# Patient Record
Sex: Female | Born: 1995 | Race: White | Hispanic: No | Marital: Single | Smoking: Current every day smoker
Health system: Southern US, Community
[De-identification: ages and names within clinical notes are randomized; demographics above are authoritative.]

## PROBLEM LIST (undated history)

## (undated) DIAGNOSIS — J45909 Unspecified asthma, uncomplicated: Secondary | ICD-10-CM

## (undated) HISTORY — DX: Unspecified asthma, uncomplicated: J45.909

---
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
## 2018-04-14 LAB — HM HIV SCREENING LAB: HM HIV Screening: NEGATIVE

## 2019-01-07 DIAGNOSIS — E669 Obesity, unspecified: Secondary | ICD-10-CM | POA: Insufficient documentation

## 2019-06-21 ENCOUNTER — Other Ambulatory Visit: Payer: Self-pay

## 2019-06-21 DIAGNOSIS — Z20822 Contact with and (suspected) exposure to covid-19: Secondary | ICD-10-CM

## 2019-06-26 LAB — NOVEL CORONAVIRUS, NAA: SARS-CoV-2, NAA: NOT DETECTED

## 2019-08-10 DIAGNOSIS — Z6839 Body mass index (BMI) 39.0-39.9, adult: Secondary | ICD-10-CM

## 2019-08-10 DIAGNOSIS — E669 Obesity, unspecified: Secondary | ICD-10-CM

## 2019-08-11 ENCOUNTER — Encounter: Payer: Self-pay | Admitting: Nurse Practitioner

## 2019-08-11 ENCOUNTER — Ambulatory Visit (LOCAL_COMMUNITY_HEALTH_CENTER): Payer: Medicaid Other | Admitting: Nurse Practitioner

## 2019-08-11 ENCOUNTER — Other Ambulatory Visit: Payer: Self-pay

## 2019-08-11 VITALS — BP 113/68 | Ht 67.0 in | Wt 261.0 lb

## 2019-08-11 DIAGNOSIS — Z3009 Encounter for other general counseling and advice on contraception: Secondary | ICD-10-CM | POA: Diagnosis not present

## 2019-08-11 DIAGNOSIS — Z3042 Encounter for surveillance of injectable contraceptive: Secondary | ICD-10-CM

## 2019-08-11 DIAGNOSIS — Z30013 Encounter for initial prescription of injectable contraceptive: Secondary | ICD-10-CM | POA: Diagnosis not present

## 2019-08-11 MED ORDER — MEDROXYPROGESTERONE ACETATE 150 MG/ML IM SUSP
150.0000 mg | Freq: Once | INTRAMUSCULAR | Status: AC
  Administered 2019-08-11: 17:00:00 150 mg via INTRAMUSCULAR

## 2019-08-11 NOTE — Progress Notes (Signed)
Here today for Depo and STD testing. 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 Hal Morales, RN  Wet Prep results reviewed. No treatment indicated per standing orders. Depo given per verbal provider order. Hal Morales, RN

## 2019-08-11 NOTE — Progress Notes (Signed)
[facility]  Subjective:  Sarah RedderSavannah L Mcintyre is a 23 y.o. being seen today for   Chief Complaint  Patient presents with  . Contraception    Depo  . Vaginal Discharge    DMPA injection and wet mount  Admits to c/o - Foul odor x 1 weeks LMP - Monday (08/08/2019) Last sex - 08/07/2019  Admits to - Sometimes use of condoms 1 partner in last 2 months Both - Oral and vaginal sex  Denies any additional concerns or complaints at this time     Does the patient have a current or past history of drug use? No   No components found for: HCV]   Health Maintenance Due  Topic Date Due  . CHLAMYDIA SCREENING  09/25/2013  . TETANUS/TDAP  09/25/2017  . INFLUENZA VACCINE  07/23/2019    Review of Systems  All other systems reviewed and are negative.   The following portions of the patient's history were reviewed and updated as appropriate: allergies, current medications, past family history, past medical history, past social history, past surgical history and problem list. Problem list updated.   See flowsheet for other program required questions.  Objective:   Vitals:   08/11/19 1525  BP: 113/68  Weight: 261 lb (118.4 kg)  Height: 5\' 7"  (1.702 m)    Physical Exam Vitals signs and nursing note reviewed.  Constitutional:      Appearance: Normal appearance.  HENT:     Head: Normocephalic and atraumatic.     Mouth/Throat:     Mouth: Mucous membranes are moist.     Pharynx: Oropharynx is clear. No oropharyngeal exudate or posterior oropharyngeal erythema.  Pulmonary:     Effort: Pulmonary effort is normal.  Abdominal:     General: Abdomen is flat.     Palpations: There is no mass.     Tenderness: There is no abdominal tenderness. There is no rebound.  Genitourinary:    General: Normal vulva.     Exam position: Lithotomy position.     Pubic Area: No rash or pubic lice.      Labia:        Right: No rash or  lesion.        Left: No rash or lesion.      Vagina: Normal. No vaginal discharge, erythema, bleeding or lesions.     Cervix: No cervical motion tenderness, discharge, friability, lesion or erythema.     Uterus: Normal.      Adnexa: Right adnexa normal and left adnexa normal.     Rectum: Normal.  Lymphadenopathy:     Head:     Right side of head: No preauricular or posterior auricular adenopathy.     Left side of head: No preauricular or posterior auricular adenopathy.     Cervical: No cervical adenopathy.     Upper Body:     Right upper body: No supraclavicular or axillary adenopathy.     Left upper body: No supraclavicular or axillary adenopathy.     Lower Body: No right inguinal adenopathy. No left inguinal adenopathy.  Skin:    General: Skin is warm and dry.     Findings: No rash.  Neurological:     Mental Status: She is alert and oriented to person, place, and time.       Assessment and Plan:  Sarah RedderSavannah L Mcintyre is a 23 y.o. female [facility]   1. Family planning services Await results - WET PREP FOR [place], [place] - please treat wet mount per standing order - Gonococcus culture - Chlamydia/Gonorrhea [facility]   2. Encounter for surveillance of injectable contraceptive Please give  - medroxyPROGESTERone (DEPO-PROVERA) injection 150 mg Today - advised client to RTC in 11-13 wks for DMPA and yearly PE.  - last DMPA on 04/18/2019 - called in by Antoine Primas, [place]   Return for 11-13 weeks for DMPA, Yearly physical exam.  No future appointments.  Berniece Andreas, NP

## 2019-08-12 ENCOUNTER — Encounter: Payer: Self-pay | Admitting: Nurse Practitioner

## 2019-08-12 DIAGNOSIS — J4520 Mild intermittent asthma, uncomplicated: Secondary | ICD-10-CM | POA: Insufficient documentation

## 2019-08-12 LAB — WET PREP FOR TRICH, YEAST, CLUE
Trichomonas Exam: NEGATIVE
Yeast Exam: NEGATIVE

## 2019-08-15 LAB — GONOCOCCUS CULTURE

## 2019-11-16 ENCOUNTER — Other Ambulatory Visit: Payer: Self-pay

## 2019-11-16 ENCOUNTER — Ambulatory Visit (LOCAL_COMMUNITY_HEALTH_CENTER): Payer: Medicaid Other

## 2019-11-16 VITALS — BP 122/78 | Ht 67.0 in | Wt 254.0 lb

## 2019-11-16 DIAGNOSIS — Z3042 Encounter for surveillance of injectable contraceptive: Secondary | ICD-10-CM

## 2019-11-16 DIAGNOSIS — Z30013 Encounter for initial prescription of injectable contraceptive: Secondary | ICD-10-CM | POA: Diagnosis not present

## 2019-11-16 DIAGNOSIS — Z3009 Encounter for other general counseling and advice on contraception: Secondary | ICD-10-CM | POA: Diagnosis not present

## 2019-11-16 MED ORDER — MEDROXYPROGESTERONE ACETATE 150 MG/ML IM SUSP
150.0000 mg | Freq: Once | INTRAMUSCULAR | Status: AC
  Administered 2019-11-16: 150 mg via INTRAMUSCULAR

## 2019-11-16 NOTE — Progress Notes (Signed)
Last physical at [facility] was 04/14/2018. Last depo at [facility] was 08/11/2019; 13.6 weeks post depo today. Pt turned 23 yrs old last month. Consulted with provider regarding pt request to continue with depo today. Per verbal order by Antoine Primas, PA, administered DMPA 150 mg IM today, and pt to schedule physical (with PAP) when next depo is due.

## 2019-12-05 ENCOUNTER — Other Ambulatory Visit: Payer: Self-pay

## 2019-12-05 ENCOUNTER — Ambulatory Visit
Admission: EM | Admit: 2019-12-05 | Discharge: 2019-12-05 | Disposition: A | Discharge status: Home / Self Care | Payer: Self-pay | Attending: Emergency Medicine | Admitting: Emergency Medicine

## 2019-12-05 DIAGNOSIS — M26621 Arthralgia of right temporomandibular joint: Secondary | ICD-10-CM | POA: Insufficient documentation

## 2019-12-05 DIAGNOSIS — Z0189 Encounter for other specified special examinations: Secondary | ICD-10-CM | POA: Insufficient documentation

## 2019-12-05 DIAGNOSIS — J029 Acute pharyngitis, unspecified: Secondary | ICD-10-CM

## 2019-12-05 DIAGNOSIS — F17210 Nicotine dependence, cigarettes, uncomplicated: Secondary | ICD-10-CM

## 2019-12-05 LAB — POCT RAPID STREP A (OFFICE): Rapid Strep A Screen: NEGATIVE

## 2019-12-05 NOTE — ED Triage Notes (Signed)
Pt presents to the UC with right sided jaw pain and right era pain x 3 days. Pt reports her job required a COVID test.

## 2019-12-05 NOTE — ED Provider Notes (Signed)
Renaldo Fiddler    CSN: [id number] Arrival date & time: 12/05/19  1450      History   Chief Complaint Chief Complaint  Patient presents with  . Otalgia  . covid test    HPI CHEETARA HOGE is a 23 y.o. female.   Patient presents with right side jaw pain and "popping" of her right TMJ x 3 days.  She also reports mild sore throat and right ear pain.  Patient states her work is requiring a COVID test.  She denies fever, chills, toothache, difficulty swallowing, congestion, cough, shortness of breath, vomiting, diarrhea, or other symptoms.  No treatments attempted at home.  The history is provided by the patient.    Past Medical History:  Diagnosis Date  . Asthma     Patient Active Problem List   Diagnosis Date Noted  . Intermittent asthma 08/12/2019  . Obesity, unspecified 01/07/2019    History reviewed. No pertinent surgical history.  OB History   No obstetric history on file.      Home Medications    Prior to Admission medications   Medication Sig Start Date End Date Taking? Authorizing Provider  albuterol (PROVENTIL) (2.5 MG/3ML) 0.083% nebulizer solution Inhale into the lungs. 04/19/19 04/18/20  [provider]  beclomethasone (QVAR) 40 MCG/ACT inhaler Inhale into the lungs. 04/19/19   [provider]    Family History Family History  Problem Relation Age of Onset  . Migraines Mother   . Melanoma Maternal Grandmother   . Clotting disorder Maternal Grandfather        blood clots in legs  . Hypertension Maternal Grandfather   . Seizures Maternal Grandfather   . Diabetes Paternal Grandmother     Social History Social History   Tobacco Use  . Smoking status: Current Every Day Smoker    Packs/day: 0.50    Types: Cigarettes  . Smokeless tobacco: Never Used  Substance Use Topics  . Alcohol use: Not Currently  . Drug use: Never     Allergies   Patient has no known allergies.   Review of Systems Review of Systems    Constitutional: Negative for chills and fever.  HENT: Positive for ear pain and sore throat. Negative for congestion, dental problem, facial swelling, rhinorrhea and trouble swallowing.   Eyes: Negative for pain and visual disturbance.  Respiratory: Negative for cough and shortness of breath.   Cardiovascular: Negative for chest pain and palpitations.  Gastrointestinal: Negative for abdominal pain, diarrhea and vomiting.  Genitourinary: Negative for dysuria and hematuria.  Musculoskeletal: Positive for arthralgias. Negative for back pain.  Skin: Negative for color change and rash.  Neurological: Negative for seizures and syncope.  All other systems reviewed and are negative.    Physical Exam Triage Vital Signs ED Triage Vitals  Enc Vitals Group     BP 12/05/19 1457 120/80     Pulse Rate 12/05/19 1457 69     Resp 12/05/19 1457 16     Temp 12/05/19 1457 98.9 F (37.2 C)     Temp Source 12/05/19 1457 Temporal     SpO2 12/05/19 1457 98 %     Weight --      Height --      Head Circumference --      Peak Flow --      Pain Score 12/05/19 1456 6     Pain Loc --      Pain Edu? --      Excl. in GC? --  No data found.  Updated Vital Signs BP 120/80 (BP Location: Right Arm)   Pulse 69   Temp 98.9 F (37.2 C) (Temporal)   Resp 16   SpO2 98%   Visual Acuity Right Eye Distance:   Left Eye Distance:   Bilateral Distance:    Right Eye Near:   Left Eye Near:    Bilateral Near:     Physical Exam Vitals and nursing note reviewed.  Constitutional:      General: She is not in acute distress.    Appearance: She is well-developed.  HENT:     Head: Normocephalic and atraumatic.     Right Ear: Tympanic membrane and ear canal normal.     Left Ear: Tympanic membrane and ear canal normal.     Nose: Nose normal.     Mouth/Throat:     Mouth: Mucous membranes are moist.     Pharynx: Posterior oropharyngeal erythema present.  Eyes:     Conjunctiva/sclera: Conjunctivae normal.   Cardiovascular:     Rate and Rhythm: Normal rate and regular rhythm.     Heart sounds: No murmur.  Pulmonary:     Effort: Pulmonary effort is normal. No respiratory distress.     Breath sounds: Normal breath sounds.  Abdominal:     General: Bowel sounds are normal.     Palpations: Abdomen is soft.     Tenderness: There is no abdominal tenderness. There is no guarding or rebound.  Musculoskeletal:     Cervical back: Neck supple.  Skin:    General: Skin is warm and dry.     Findings: No rash.  Neurological:     General: No focal deficit present.     Mental Status: She is alert and oriented to person, place, and time.  Psychiatric:        Mood and Affect: Mood normal.        Behavior: Behavior normal.      UC Treatments / Results  Labs (all labs ordered are listed, but only abnormal results are displayed) Labs Reviewed  NOVEL CORONAVIRUS, NAA  CULTURE, GROUP A STREP [facility]  POCT RAPID STREP A (OFFICE)    EKG   Radiology No results found.  Procedures Procedures (including critical care time)  Medications Ordered in UC Medications - No data to display  Initial Impression / Assessment and Plan / UC Course  I have reviewed the triage vital signs and the nursing notes.  Pertinent labs & imaging results that were available during my care of the patient were reviewed by me and considered in my medical decision making (see chart for details).    Right TMJ pain.  Patient request for COVID test.  Rapid strep negative; throat culture pending.  Instructed patient to take Tylenol as needed for discomfort.  Instructed her to rest her TMJ and try not to open her jaw wide enough for it to "pop".  Instructed patient to follow-up with her PCP if her symptoms or not improving.  COVID test performed here.  Instructed patient to self quarantine until the test result is back.  Instructed patient to go to the emergency department if she develops high fever, shortness of breath, severe  diarrhea, or other concerning symptoms.  Patient agrees with plan of care.   Final Clinical Impressions(s) / UC Diagnoses   Final diagnoses:  Arthralgia of right temporomandibular joint  Patient request for diagnostic testing     Discharge Instructions     Take Tylenol as needed for  discomfort.    Your rapid strep test is negative.  A throat culture is pending; we will call you if it is positive requiring treatment.    Your COVID test is pending.  You should self quarantine until your test result is back and is negative.    Go to the emergency department if you develop high fever, shortness of breath, severe diarrhea, or other concerning symptoms.       ED Prescriptions    None     PDMP not reviewed this encounter.   Sharion Balloon, NP 12/05/19 1539

## 2019-12-05 NOTE — Discharge Instructions (Addendum)
Take Tylenol as needed for discomfort.    Your rapid strep test is negative.  A throat culture is pending; we will call you if it is positive requiring treatment.    Your COVID test is pending.  You should self quarantine until your test result is back and is negative.    Go to the emergency department if you develop high fever, shortness of breath, severe diarrhea, or other concerning symptoms.

## 2019-12-07 LAB — NOVEL CORONAVIRUS, NAA: SARS-CoV-2, NAA: NOT DETECTED

## 2019-12-08 LAB — CULTURE, GROUP A STREP (THRC)

## 2020-02-27 ENCOUNTER — Ambulatory Visit: Payer: Medicaid Other | Admitting: Advanced Practice Midwife

## 2020-02-27 ENCOUNTER — Encounter: Payer: Self-pay | Admitting: Advanced Practice Midwife

## 2020-02-27 ENCOUNTER — Other Ambulatory Visit: Payer: Self-pay

## 2020-02-27 VITALS — BP 124/76 | Ht 67.0 in | Wt 254.2 lb

## 2020-02-27 DIAGNOSIS — Z3009 Encounter for other general counseling and advice on contraception: Secondary | ICD-10-CM

## 2020-02-27 DIAGNOSIS — Z30013 Encounter for initial prescription of injectable contraceptive: Secondary | ICD-10-CM

## 2020-02-27 DIAGNOSIS — F17200 Nicotine dependence, unspecified, uncomplicated: Secondary | ICD-10-CM

## 2020-02-27 DIAGNOSIS — Z3042 Encounter for surveillance of injectable contraceptive: Secondary | ICD-10-CM

## 2020-02-27 LAB — WET PREP FOR TRICH, YEAST, CLUE
Trichomonas Exam: NEGATIVE
Yeast Exam: NEGATIVE

## 2020-02-27 MED ORDER — MEDROXYPROGESTERONE ACETATE 150 MG/ML IM SUSP
150.0000 mg | Freq: Once | INTRAMUSCULAR | Status: AC
  Administered 2020-02-27: 150 mg via INTRAMUSCULAR

## 2020-02-27 NOTE — Progress Notes (Signed)
In to continue Depo "1 more time"; aware pap smear due; declines other testing Sharlette Dense, RN Wet prep reviewed-no Tx indicated Sharlette Dense, RN

## 2020-02-27 NOTE — Progress Notes (Signed)
   [facility] problem [facility]  [facility]  Subjective:  Sarah Mcintyre is a 24 y.o.SWF nullip smoker being seen today for more DMPA and pap  Chief Complaint  Patient presents with  . Contraception    HPI  Happy with DMPA and wants more.  LMP none on Depo.  Last sex 02/25/20 with condom.  1 sex partner.  Last physical 04/14/18. Last DMPA 08/11/19. Does the patient have a current or past history of drug use? No   No components found for: HCV]   Health Maintenance Due  Topic Date Due  . CHLAMYDIA SCREENING  09/25/2013  . TETANUS/TDAP  09/25/2017  . INFLUENZA VACCINE  07/23/2019  . PAP-Cervical Cytology Screening  09/26/2019  . PAP SMEAR-Modifier  09/26/2019    ROS  The following portions of the patient's history were reviewed and updated as appropriate: allergies, current medications, past family history, past medical history, past social history, past surgical history and problem list. Problem list updated.   See flowsheet for other program required questions.  Objective:   Vitals:   02/27/20 1412  BP: 124/76  Weight: 254 lb 3.2 oz (115.3 kg)  Height: 5\' 7"  (1.702 m)    Physical Exam Abdominal:     Palpations: Abdomen is soft.     Comments: Poor tone, soft without tenderness, increased adipose  Genitourinary:    General: Normal vulva.     Exam position: Lithotomy position.     Vagina: Vaginal discharge (white creamy, ph<4.5) present.     Cervix: Normal.     Uterus: Normal.      Adnexa: Right adnexa normal and left adnexa normal.     Rectum: Normal.       Assessment and Plan:  Sarah Mcintyre is a 24 y.o. female presenting to the [facility] for a Women's Health problem visit  1. Family planning Pap done. Treat wet mount per standing order Immunization nurse consult Counseled via 5 A's to stop smoking - WET PREP FOR TRICH, YEAST, CLUE - Chlamydia/Gonorrhea [place] [facility] - Pap IG (Image  Guided)  2. Encounter for surveillance of injectable contraceptive May have DMPA 150 mg IM x1     Sarah Mcintyre for 11-13 wk DMPA.  No future appointments.  June, CNM

## 2020-02-29 LAB — PAP IG (IMAGE GUIDED): PAP Smear Comment: 0

## 2020-05-25 ENCOUNTER — Ambulatory Visit
Admission: EM | Admit: 2020-05-25 | Discharge: 2020-05-25 | Disposition: A | Discharge status: Home / Self Care | Payer: Self-pay | Attending: Emergency Medicine | Admitting: Emergency Medicine

## 2020-05-25 ENCOUNTER — Other Ambulatory Visit: Payer: Self-pay

## 2020-05-25 ENCOUNTER — Telehealth: Payer: Self-pay | Admitting: Emergency Medicine

## 2020-05-25 DIAGNOSIS — B850 Pediculosis due to Pediculus humanus capitis: Secondary | ICD-10-CM

## 2020-05-25 MED ORDER — SPINOSAD 0.9 % EX SUSP: 1.0000 | Freq: Once | CUTANEOUS | 0 refills | Status: DC

## 2020-05-25 MED ORDER — PERMETHRIN 1 % EX LOTN: 2.0000 "application " | TOPICAL_LOTION | Freq: Once | CUTANEOUS | 0 refills | Status: AC

## 2020-05-25 NOTE — Discharge Instructions (Addendum)
Use the Elimite as directed.    Follow up with your primary care provider if your symptoms are not improving.

## 2020-05-25 NOTE — ED Provider Notes (Signed)
Renaldo Fiddler    CSN: [id number] Arrival date & time: 05/25/20  1414      History   Chief Complaint Chief Complaint  Patient presents with  . Head Lice    HPI Sarah Mcintyre is a 24 y.o. female.   Patient presents with scalp itching x3 weeks.  She states she was scratching today and noted a "bug" under her fingernail.  She believes she has had lice.  No treatments attempted at home.  She also reports a pruritic rash on her posterior neck at her hairline.  She denies fever, chills, or other symptoms.    The history is provided by the patient.    Past Medical History:  Diagnosis Date  . Asthma     Patient Active Problem List   Diagnosis Date Noted  . Smoker 1 ppd 02/27/2020  . Intermittent asthma 08/12/2019  . Obesity, unspecified BMI=39.1 01/07/2019    No past surgical history on file.  OB History   No obstetric history on file.      Home Medications    Prior to Admission medications   Medication Sig Start Date End Date Taking? Authorizing Provider  permethrin (ELIMITE) 1 % lotion Apply 2 application topically once for 1 dose. Shampoo, rinse and towel dry hair, saturate hair and scalp with permethrin. Rinse after 10 min; repeat in 9-10 days if needed. 05/25/20 05/25/20  Mickie Bail, NP    Family History Family History  Problem Relation Age of Onset  . Migraines Mother   . Melanoma Maternal Grandmother   . Clotting disorder Maternal Grandfather        blood clots in legs  . Hypertension Maternal Grandfather   . Seizures Maternal Grandfather   . Diabetes Paternal Grandmother     Social History Social History   Tobacco Use  . Smoking status: Current Every Day Smoker    Packs/day: 0.50    Types: Cigarettes  . Smokeless tobacco: Never Used  Substance Use Topics  . Alcohol use: Not Currently  . Drug use: Never     Allergies   Patient has no known allergies.   Review of Systems Review of Systems  Constitutional: Negative for chills and  fever.  HENT: Negative for ear pain and sore throat.   Eyes: Negative for pain and visual disturbance.  Respiratory: Negative for cough and shortness of breath.   Cardiovascular: Negative for chest pain and palpitations.  Gastrointestinal: Negative for abdominal pain and vomiting.  Genitourinary: Negative for dysuria and hematuria.  Musculoskeletal: Negative for arthralgias and back pain.  Skin: Positive for rash. Negative for color change.  Neurological: Negative for seizures and syncope.  All other systems reviewed and are negative.    Physical Exam Triage Vital Signs ED Triage Vitals [05/25/20 1415]  Enc Vitals Group     BP 133/87     Pulse Rate (!) 107     Resp 16     Temp 98.2 F (36.8 C)     Temp src      SpO2 95 %     Weight      Height      Head Circumference      Peak Flow      Pain Score 0     Pain Loc      Pain Edu?      Excl. in GC?    No data found.  Updated Vital Signs BP 133/87   Pulse (!) 107   Temp 98.2 F (  36.8 C)   Resp 16   LMP  (LMP Unknown)   SpO2 95%   Visual Acuity Right Eye Distance:   Left Eye Distance:   Bilateral Distance:    Right Eye Near:   Left Eye Near:    Bilateral Near:     Physical Exam Vitals and nursing note reviewed.  Constitutional:      General: She is not in acute distress.    Appearance: She is well-developed.  HENT:     Head: Normocephalic and atraumatic.     Mouth/Throat:     Mouth: Mucous membranes are moist.  Eyes:     Conjunctiva/sclera: Conjunctivae normal.  Cardiovascular:     Rate and Rhythm: Normal rate and regular rhythm.     Heart sounds: No murmur.  Pulmonary:     Effort: Pulmonary effort is normal. No respiratory distress.     Breath sounds: Normal breath sounds.  Abdominal:     Palpations: Abdomen is soft.     Tenderness: There is no abdominal tenderness.  Musculoskeletal:     Cervical back: Neck supple.  Skin:    General: Skin is warm and dry.     Findings: Rash present.      Comments: Nits noted in hair.   Neurological:     Mental Status: She is alert and oriented to person, place, and time.     Gait: Gait normal.  Psychiatric:        Mood and Affect: Mood normal.        Behavior: Behavior normal.      UC Treatments / Results  Labs (all labs ordered are listed, but only abnormal results are displayed) Labs Reviewed - No data to display  EKG   Radiology No results found.  Procedures Procedures (including critical care time)  Medications Ordered in UC Medications - No data to display  Initial Impression / Assessment and Plan / UC Course  I have reviewed the triage vital signs and the nursing notes.  Pertinent labs & imaging results that were available during my care of the patient were reviewed by me and considered in my medical decision making (see chart for details).   Pediculosis capitis.  Treating with Elimite 1% lotion.  Education provided for treatment of patient and environment.  Told her to follow-up with her PCP if her symptoms or not improving.  Patient agrees to plan of care.     Final Clinical Impressions(s) / UC Diagnoses   Final diagnoses:  Pediculosis capitis     Discharge Instructions     Use the Elimite as directed.    Follow up with your primary care provider if your symptoms are not improving.       ED Prescriptions    Medication Sig Dispense Auth. Provider   permethrin (ELIMITE) 1 % lotion Apply 2 application topically once for 1 dose. Shampoo, rinse and towel dry hair, saturate hair and scalp with permethrin. Rinse after 10 min; repeat in 9-10 days if needed. 59 mL Sharion Balloon, NP     PDMP not reviewed this encounter.   Sharion Balloon, NP 05/25/20 1447

## 2020-05-25 NOTE — ED Triage Notes (Signed)
Pt c/o itching scalp and rash to back of neck x 3 weeks, scratched head earlier and found a bug

## 2020-05-25 NOTE — Telephone Encounter (Signed)
Patient called UC to say that she wants a different prescription for her head lice called in to the pharmacy.  She would like [place] instead of Elimite.  Prescription changed.

## 2020-05-28 ENCOUNTER — Telehealth: Payer: Self-pay

## 2020-05-28 DIAGNOSIS — B850 Pediculosis due to Pediculus humanus capitis: Secondary | ICD-10-CM

## 2020-05-28 MED ORDER — SPINOSAD 0.9 % EX SUSP: 1.0000 | Freq: Once | CUTANEOUS | 0 refills | Status: AC

## 2020-05-28 NOTE — Telephone Encounter (Signed)
Patient called requesting prescription from Friday be sent to [facility]. Medication electronically sent.

## 2020-06-28 ENCOUNTER — Ambulatory Visit (INDEPENDENT_AMBULATORY_CARE_PROVIDER_SITE_OTHER)
Admission: RE | Admit: 2020-06-28 | Discharge: 2020-06-28 | Disposition: A | Discharge status: Home / Self Care | Payer: Medicaid Other | Source: Ambulatory Visit

## 2020-06-28 DIAGNOSIS — J45901 Unspecified asthma with (acute) exacerbation: Secondary | ICD-10-CM

## 2020-06-28 MED ORDER — AMOXICILLIN-POT CLAVULANATE 875-125 MG PO TABS: 1.0000 | ORAL_TABLET | Freq: Two times a day (BID) | ORAL | 0 refills | Status: AC

## 2020-06-28 MED ORDER — PREDNISONE 10 MG PO TABS: 40.0000 mg | ORAL_TABLET | Freq: Every day | ORAL | 0 refills | Status: AC

## 2020-06-28 NOTE — ED Provider Notes (Signed)
Virtual Visit via Video Note:  [name]  initiated request for Telemedicine visit with [facility] team. I connected with [name]  on 06/28/2020 at 5:12 PM  for a synchronized telemedicine visit using a video enabled HIPPA compliant telemedicine application. I verified that I am speaking with [name]  using two identifiers. Mickie Bail, NP  was physically located in a [facility] Urgent care site and GWYNNE KEMNITZ was located at a different location.   The limitations of evaluation and management by telemedicine as well as the availability of in-person appointments were discussed. Patient was informed that she  may incur a bill ( including co-pay) for this virtual visit encounter. Mosella L Zulauf  expressed understanding and gave verbal consent to proceed with virtual visit.     History of Present Illness:Sarah Mcintyre  is a 24 y.o. female presents for evaluation of wheezing, cough which isoccasionally productive of clear or green phlegm, mild shortness of breath with coughing spells, and nasal congestion x 2 weeks.  History of Asthma.  Treatment attempted at home with Mucinex and albuterol nebs.  She reports her albuterol nebs and MDI are in date and she does not need refills.  She denies fever, chills, rash, abdominal pain, vomiting, diarrhea, or other symptoms.  She denies current pregnancy or breastfeeding.      No Known Allergies   Past Medical History:  Diagnosis Date  . Asthma      Social History   Tobacco Use  . Smoking status: Current Every Day Smoker    Packs/day: 0.50    Types: Cigarettes  . Smokeless tobacco: Never Used  Substance Use Topics  . Alcohol use: Not Currently  . Drug use: Never   ROS: as stated in HPI.  All other systems reviewed and negative.     Observations/Objective: Physical Exam  VITALS: Patient denies fever. GENERAL: Alert, appears well and in no acute distress. HEENT: Atraumatic. Oral mucosa appears  moist. NECK: Normal movements of the head and neck. CARDIOPULMONARY: No increased WOB. Speaking in clear sentences. I:E ratio WNL.  MS: Moves all visible extremities without noticeable abnormality. PSYCH: Pleasant and cooperative, well-groomed. Speech normal rate and rhythm. Affect is appropriate. Insight and judgement are appropriate. Attention is focused, linear, and appropriate.  NEURO: CN grossly intact. Oriented as arrived to appointment on time with no prompting. Moves both UE equally.  SKIN: No obvious lesions, wounds, erythema, or cyanosis noted on face or hands.   Assessment and Plan:    ICD-10-CM   1. Asthma with acute exacerbation, unspecified asthma severity, unspecified whether persistent  J45.901        Follow Up Instructions: Patient reports she does not need refills on her albuterol nebs or MDI.  Treating with Augmentin and prednisone.  Instructed patient to continue continue using albuterol as directed.  Instructed patient to go to the ED if she has acute shortness of breath or other concerning symptoms.  Instructed her to follow-up with her PCP or come here to be seen in person if her symptoms are not improving.  Patient agrees to plan of care.      I discussed the assessment and treatment plan with the patient. The patient was provided an opportunity to ask questions and all were answered. The patient agreed with the plan and demonstrated an understanding of the instructions.   The patient was advised to call back or seek an in-person evaluation if the symptoms worsen or  if the condition fails to improve as anticipated.      Mickie Bail, NP  06/28/2020 5:12 PM         Mickie Bail, NP 06/28/20 1712

## 2020-06-28 NOTE — Discharge Instructions (Addendum)
Take the prednisone and Augmentin as directed.  Continue to use your albuterol nebulizer and inhaler as directed.    Follow up with your primary care provider or come here to be seen in person if your symptoms are not improving.    Go to the emergency department if you have acute shortness of breath or other concerning symptoms.

## 2022-02-28 ENCOUNTER — Encounter: Payer: Self-pay | Admitting: Advanced Practice Midwife

## 2022-02-28 ENCOUNTER — Other Ambulatory Visit: Payer: Self-pay

## 2022-02-28 ENCOUNTER — Ambulatory Visit (LOCAL_COMMUNITY_HEALTH_CENTER): Payer: Medicaid Other | Admitting: Advanced Practice Midwife

## 2022-02-28 VITALS — BP 122/69 | Ht 67.0 in | Wt 238.8 lb

## 2022-02-28 DIAGNOSIS — Z3009 Encounter for other general counseling and advice on contraception: Secondary | ICD-10-CM | POA: Diagnosis not present

## 2022-02-28 DIAGNOSIS — Z01419 Encounter for gynecological examination (general) (routine) without abnormal findings: Secondary | ICD-10-CM

## 2022-02-28 DIAGNOSIS — E669 Obesity, unspecified: Secondary | ICD-10-CM | POA: Insufficient documentation

## 2022-02-28 DIAGNOSIS — F1290 Cannabis use, unspecified, uncomplicated: Secondary | ICD-10-CM

## 2022-02-28 LAB — WET PREP FOR TRICH, YEAST, CLUE
Trichomonas Exam: NEGATIVE
Yeast Exam: NEGATIVE

## 2022-02-28 NOTE — Progress Notes (Signed)
Patient seen for annual exam, STD check. Wet prep was reviewed, no per standing orders. Condoms declined. PCP list given.  ?

## 2022-02-28 NOTE — Progress Notes (Signed)
[facility] DEPARTMENT ?Family Planning Clinic ?[address] [employer] ?Main Number: [phone] ? ? ? ?Family Planning Visit- Initial Visit ? ?Subjective:  ?Sarah Mcintyre is a 26 y.o. SWF smoker [id number]   being seen today for an initial annual visit and to discuss reproductive life planning.  The patient is currently using No Method - Other Reason for pregnancy prevention. Patient reports   does not want a pregnancy in the next year.  Patient has the following medical conditions has Obesity, unspecified BMI=39.1; Intermittent asthma; Smoker 1 ppd; and Obesity BMI=37.4 on their problem list. ? ?Chief Complaint  ?Patient presents with  ? Annual Exam  ? ? ?Patient reports here for physical. Last physical 02/27/20. Last pap 02/27/20 neg. Last DMPA 02/27/20. LMP 02/17/22. Last sex today without condom; with current partner x8 years; 1 partner in last 3 mo. Doesn't want pregnancy but doesn't want birth control and refuses Plan B. Smoking 10 cpd. Last vaped last week. Last MJ 09/2021. Last ETOH 12/21/21 (2 mixed drinks) 1x/year. Last dental exam age [age]. Employed 36 hrs/wk and not in school. Living with boyfriend and his dad.  ? ?Patient denies cigars ? ?Body mass index is 37.4 kg/m?. - Patient is eligible for diabetes screening based on BMI and age >89?  not applicable ?HA1C ordered? not applicable ? ?Patient reports 1  partner/s in last year. Desires STI screening?  No - denies bloodwork ? ?Has patient been screened once for HCV in the past?  No ? No results found for: HCVAB ? ?Does the patient have current drug use (including MJ), have a partner with drug use, and/or has been incarcerated since last result? No  ?If yes-- Screen for HCV through [facility] ?  ?Does the patient meet criteria for HBV testing? No ? ?Criteria:  ?-Household, sexual or needle sharing contact with HBV ?-History of drug use ?-HIV positive ?-Those with known Hep C ? ? ?Health Maintenance Due  ?Topic Date Due  ? COVID-19 Vaccine  (1) Never done  ? HPV VACCINES (1 - 2-dose series) Never done  ? CHLAMYDIA SCREENING  Never done  ? Hepatitis C Screening  Never done  ? TETANUS/TDAP  Never done  ? INFLUENZA VACCINE  Never done  ? ? ?ROS ? ?The following portions of the patient's history were reviewed and updated as appropriate: allergies, current medications, past family history, past medical history, past social history, past surgical history and problem list. Problem list updated. ? ? ?See flowsheet for other program required questions. ? ?Objective:  ? ?Vitals:  ? 02/28/22 1509  ?BP: 122/69  ?Weight: 238 lb 12.8 oz (108.3 kg)  ?Height: 5\' 7"  (1.702 m)  ? ? ?Physical Exam ?Constitutional:   ?   Appearance: Normal appearance. She is obese.  ?HENT:  ?   Head: Normocephalic and atraumatic.  ?   Mouth/Throat:  ?   Mouth: Mucous membranes are moist.  ?   Comments: Last dental exam 26 yo ?Eyes:  ?   Conjunctiva/sclera: Conjunctivae normal.  ?Neck:  ?   Thyroid: No thyroid mass, thyromegaly or thyroid tenderness.  ?Cardiovascular:  ?   Rate and Rhythm: Normal rate and regular rhythm.  ?Pulmonary:  ?   Effort: Pulmonary effort is normal.  ?   Breath sounds: Normal breath sounds.  ?Chest:  ?Breasts: ?   Right: Normal.  ?   Left: Normal.  ?Abdominal:  ?   Palpations: Abdomen is soft.  ?   Comments: Poor tone, soft  without masses or tenderness, poor tone  ?Genitourinary: ?   General: Normal vulva.  ?   Exam position: Lithotomy position.  ?   Vagina: Vaginal discharge (white creamy luekorrhea, ph<4.5) present.  ?   Cervix: Normal.  ?   Uterus: Normal.   ?   Adnexa: Right adnexa normal and left adnexa normal.  ?   Rectum: Normal.  ?Musculoskeletal:     ?   General: Normal range of motion.  ?   Cervical back: Normal range of motion and neck supple.  ?Skin: ?   General: Skin is warm and dry.  ?Neurological:  ?   Mental Status: She is alert.  ?Psychiatric:     ?   Mood and Affect: Mood normal.  ? ? ? ? ?Assessment and Plan:  ?Sarah Mcintyre is a 26 y.o. female  presenting to the [facility] Department for an initial annual wellness/contraceptive visit ? ?Contraception counseling: Reviewed all forms of birth control options in the tiered based approach. available including abstinence; over the counter/barrier methods; hormonal contraceptive medication including pill, patch, ring, injection,contraceptive implant, ECP; hormonal and nonhormonal IUDs; permanent sterilization options including vasectomy and the various tubal sterilization modalities. Risks, benefits, and typical effectiveness rates were reviewed.  Questions were answered.  Written information was also given to the patient to review.  Patient desires No Method - Other Reason, this was prescribed for patient.  ? ? ?The patient will follow up in  1 year or prn for surveillance.  The patient was told to call with any further questions, or with any concerns about this method of contraception.  Emphasized use of condoms 100% of the time for STI prevention. ? ?Patient was offered ECP based on Unprotected sex within past 72 hours. ECP was not accepted by the patient. ECP counseling was not given - see RN documentation ? ?1. Obesity, unspecified classification, unspecified obesity type, unspecified whether serious comorbidity present ? ?- WET PREP FOR TRICH, YEAST, CLUE ?- Chlamydia/Gonorrhea [place] [facility] ? ?2. Family planning ?Please give pt dental list and encourage exam asap ?Treat wet mount per standing orders ?Immunization nurse consult ? ?3. Well woman exam with routine gynecological exam ?Pt declines Plan B or birth control today ? ? ? ? ?No follow-ups on file. ? ?No future appointments. ? ?Alberteen Spindle, CNM ? ?

## 2022-02-28 NOTE — Progress Notes (Signed)
annual

## 2022-10-06 ENCOUNTER — Telehealth: Payer: BC Managed Care – PPO | Admitting: Emergency Medicine

## 2022-10-06 DIAGNOSIS — J329 Chronic sinusitis, unspecified: Secondary | ICD-10-CM

## 2022-10-06 MED ORDER — AMOXICILLIN-POT CLAVULANATE 875-125 MG PO TABS: 1.0000 | ORAL_TABLET | Freq: Two times a day (BID) | ORAL | 0 refills | Status: AC

## 2022-10-06 NOTE — Progress Notes (Signed)
Virtual Visit Consent   Sarah Mcintyre, you are scheduled for a virtual visit with a [facility] provider today. Just as with appointments in the office, your consent must be obtained to participate. Your consent will be active for this visit and any virtual visit you may have with one of our providers in the next 365 days. If you have a MyChart account, a copy of this consent can be sent to you electronically.  As this is a virtual visit, video technology does not allow for your provider to perform a traditional examination. This may limit your provider's ability to fully assess your condition. If your provider identifies any concerns that need to be evaluated in person or the need to arrange testing (such as labs, EKG, etc.), we will make arrangements to do so. Although advances in technology are sophisticated, we cannot ensure that it will always work on either your end or our end. If the connection with a video visit is poor, the visit may have to be switched to a telephone visit. With either a video or telephone visit, we are not always able to ensure that we have a secure connection.  By engaging in this virtual visit, you consent to the provision of healthcare and authorize for your insurance to be billed (if applicable) for the services provided during this visit. Depending on your insurance coverage, you may receive a charge related to this service.  I need to obtain your verbal consent now. Are you willing to proceed with your visit today? Sarah Mcintyre has provided verbal consent on 10/06/2022 for a virtual visit (video or telephone). Montine Circle, PA-C  Date: 10/06/2022 4:15 PM  Virtual Visit via Video Note   I, Montine Circle, connected with  Sarah Mcintyre  ([id number], 07/31/1996) on 10/06/22 at  4:15 PM EDT by a video-enabled telemedicine application and verified that I am speaking with the correct person using two identifiers.  Location: Patient: Virtual Visit Location  Patient: Home Provider: Virtual Visit Location Provider: Home   I discussed the limitations of evaluation and management by telemedicine and the availability of in person appointments. The patient expressed understanding and agreed to proceed.    History of Present Illness: Sarah Mcintyre is a 26 y.o. who identifies as a female who was assigned female at birth, and is being seen today for cough and cold symptoms for the past 3 weeks.  States that she has wheezing in her chest.  Denies fever.  Reports that she has been using allergy meds and inhaler.  Reports associated sinus pressure.  Denies pregnancy or breast feeding.  HPI: HPI  Problems:  Patient Active Problem List   Diagnosis Date Noted   Obesity BMI=37.4 02/28/2022   Marijuana use 02/28/2022   Smoker 1 ppd 02/27/2020   Intermittent asthma 08/12/2019   Obesity, unspecified BMI=39.1 01/07/2019    Allergies: No Known Allergies Medications: No current outpatient medications on file.  Observations/Objective: Patient is well-developed, well-nourished in no acute distress.  Resting comfortably  at home.  Head is normocephalic, atraumatic.  No labored breathing.  Speech is clear and coherent with logical content.  Patient is alert and oriented at baseline.    Assessment and Plan: 1. Sinusitis, unspecified chronicity, unspecified location  - Augmentin - Inhaler, 2 puffs q6hr x 2 days  Meds ordered this encounter  Medications   amoxicillin-clavulanate (AUGMENTIN) 875-125 MG tablet    Sig: Take 1 tablet by mouth every 12 (twelve) hours.    Dispense:  14 tablet    Refill:  0    Order Specific Question:   Supervising Provider    Answer:   Chase Picket [id number]     Follow Up Instructions: I discussed the assessment and treatment plan with the patient. The patient was provided an opportunity to ask questions and all were answered. The patient agreed with the plan and demonstrated an understanding of the instructions.  A  copy of instructions were sent to the patient via MyChart unless otherwise noted below.     The patient was advised to call back or seek an in-person evaluation if the symptoms worsen or if the condition fails to improve as anticipated.  Time:  I spent 10 minutes with the patient via telehealth technology discussing the above problems/concerns.    Montine Circle, PA-C

## 2023-02-05 DIAGNOSIS — Z01411 Encounter for gynecological examination (general) (routine) with abnormal findings: Secondary | ICD-10-CM | POA: Diagnosis not present

## 2023-02-05 DIAGNOSIS — N939 Abnormal uterine and vaginal bleeding, unspecified: Secondary | ICD-10-CM | POA: Diagnosis not present

## 2023-02-05 DIAGNOSIS — Z01419 Encounter for gynecological examination (general) (routine) without abnormal findings: Secondary | ICD-10-CM | POA: Diagnosis not present

## 2023-02-24 DIAGNOSIS — E282 Polycystic ovarian syndrome: Secondary | ICD-10-CM | POA: Diagnosis not present

## 2023-02-24 DIAGNOSIS — N939 Abnormal uterine and vaginal bleeding, unspecified: Secondary | ICD-10-CM | POA: Diagnosis not present

## 2023-07-14 DIAGNOSIS — N920 Excessive and frequent menstruation with regular cycle: Secondary | ICD-10-CM | POA: Diagnosis not present

## 2023-07-14 DIAGNOSIS — N939 Abnormal uterine and vaginal bleeding, unspecified: Secondary | ICD-10-CM | POA: Diagnosis not present

## 2023-07-14 DIAGNOSIS — R102 Pelvic and perineal pain: Secondary | ICD-10-CM | POA: Diagnosis not present

## 2023-07-14 DIAGNOSIS — K5900 Constipation, unspecified: Secondary | ICD-10-CM | POA: Diagnosis not present

## 2023-07-14 DIAGNOSIS — E282 Polycystic ovarian syndrome: Secondary | ICD-10-CM | POA: Diagnosis not present

## 2023-07-14 DIAGNOSIS — N83291 Other ovarian cyst, right side: Secondary | ICD-10-CM | POA: Diagnosis not present

## 2023-09-23 ENCOUNTER — Telehealth: Payer: Self-pay | Admitting: Family Medicine

## 2023-09-23 NOTE — Telephone Encounter (Signed)
Pt wants to schedule an appt to get checked, but has some questions about the different tests and would like for someone from the clinic to call her back. Thanks

## 2023-09-24 NOTE — Telephone Encounter (Signed)
LM for pt to return my call

## 2023-09-25 NOTE — Telephone Encounter (Signed)
Pt states "I was able to figure it out."  Pt informed to call with any questions or concerns.  Berdie Ogren, RN

## 2024-10-14 ENCOUNTER — Encounter: Payer: Self-pay | Admitting: Family Medicine

## 2024-10-14 ENCOUNTER — Ambulatory Visit: Admitting: Family Medicine

## 2024-10-14 DIAGNOSIS — Z113 Encounter for screening for infections with a predominantly sexual mode of transmission: Secondary | ICD-10-CM

## 2024-10-14 LAB — WET PREP FOR TRICH, YEAST, CLUE
Clue Cell Exam: NEGATIVE
Trichomonas Exam: NEGATIVE
Yeast Exam: NEGATIVE

## 2024-10-14 LAB — HM HIV SCREENING LAB: HM HIV Screening: NEGATIVE

## 2024-10-14 NOTE — Progress Notes (Signed)
 Pt is here for STD screening. Wet prep results reviewed with patient and requires no treatment per SO. Condoms declined. Kwadwo Shterna Laramee,RN.

## 2024-10-14 NOTE — Progress Notes (Signed)
 [facility] Department STI clinic [address] [place] [address] Main phone: [phone]  STI screening visit  Subjective:  Sarah Mcintyre is a 28 y.o. female being seen today for an STI screening visit. The patient reports they do have symptoms.  Patient reports that they do not desire a pregnancy in the next year. Patient is currently using no method - not using contraception to prevent pregnancy. They reported they are not interested in discussing contraception today.    Patient's last menstrual period was 10/10/2024.  Patient has the following medical conditions:  Patient Active Problem List   Diagnosis Date Noted   Obesity BMI=37.4 02/28/2022   Marijuana use 02/28/2022   Smoker 1 ppd 02/27/2020   Intermittent asthma 08/12/2019   Obesity, unspecified BMI=39.1 01/07/2019   Chief Complaint  Patient presents with   SEXUALLY TRANSMITTED DISEASE    Pt is here for STD screening and has symptoms    HPI Patient reports to clinic with about 1 week of vaginal discharge and irritation starting after her cycle. Reports she had a fluconazole at home, and took this yesterday at 3p. She states her symptoms have improved  Does the patient using douching products? Practitioner oversight- forgot to asks  See flowsheet for further details and programmatic requirements Hyperlink available at the top of the signed note in blue.  Flow sheet content below:  Pregnancy Intention Screening Does the patient want to become pregnant in the next year?: No Does the patient's partner want to become pregnant in the next year?: No Would the patient like to discuss contraceptive options today?: No All Patients Anyone smoke around pt and/or pt's children?: No Anyone smoke inside pt's house?: No Anyone smoke inside car?: No Anyone smoke inside the workplace?: No Reason For STD Screen STD Screening: Has symptoms Have you ever had an STD?: No History of  Antibiotic use in the past 2 weeks?: No STD Symptoms Discharge: Yes Vaginal irritation: Yes Vaginal irritation s/s: burning x 1 week Risk Factors for Hep B Household, sexual, or needle sharing contact of a person infected with Hep B: No Sexual contact with a person who uses drugs not as prescribed?: No Currently or Ever used drugs not as prescribed: No HIV Positive: No PRep Patient: No Men who have sex with men: No Have Hepatitis C: No History of Incarceration: No History of Homeslessness?: No Anal sex following anal drug use?: No Risk Factors for Hep C Currently using drugs not as prescribed: No Sexual partner(s) currently using drugs as not prescribed: No History of drug use: No HIV Positive: No People with a history of incarceration: No People born between the years of 40 and 74: No Abuse History Has patient ever been abused physically?: No Has patient ever been abused sexually?: No Does patient feel they have a problem with Anxiety?: No Does patient feel they have a problem with Depression?: No Counseling Patient counseled to use condoms with all sex: Condoms declined Test results given to patient Patient counseled to use condoms with all sex: Condoms declined   Screening for MPX risk:  Unexplained rash?  No   MSM?  No   Multiple or anonymous sex partners?  No   Any close or sexual contact with a person  diagnosed with MPX?  No   Any outside the [place]  where MPX is endemic?  No   High clinical suspicion for MPX?    -Unlikely to be chickenpox    -Lymphadenopathy    -  Rash that presents in same phase of       evolution on any given body part  No   Screenings: Last HIV test per patient/review of record was  Lab Results  Component Value Date   HMHIVSCREEN Negative - Validated 04/14/2018   No results found for: HIV   Last HEPC test per patient/review of record was No results found for: HMHEPCSCREEN No components found for: HEPC   Last HEPB test per  patient/review of record was No components found for: HMHEPBSCREEN   Patient reports last pap was:   Lab Results  Component Value Date   SPECADGYN Comment 02/27/2020   Result Date Procedure Results Follow-ups  02/27/2020 Pap IG (Image Guided) DIAGNOSIS:: Comment Specimen adequacy:: Comment Clinician Provided ICD10: Comment Performed by:: Comment PAP Smear Comment: . Note:: Comment Test Methodology: Comment     Immunization history:   There is no immunization history on file for this patient.  The following portions of the patient's history were reviewed and updated as appropriate: allergies, current medications, past medical history, past social history, past surgical history and problem list.  Objective:  There were no vitals filed for this visit.  Physical Exam Vitals and nursing note reviewed. Exam conducted with a chaperone present ([facility]).  Constitutional:      Appearance: Normal appearance.  HENT:     Head: Normocephalic and atraumatic.     Mouth/Throat:     Mouth: Mucous membranes are moist.     Pharynx: Oropharynx is clear. No oropharyngeal exudate or posterior oropharyngeal erythema.  Pulmonary:     Effort: Pulmonary effort is normal.  Abdominal:     General: Abdomen is flat.     Palpations: There is no mass.     Tenderness: There is no abdominal tenderness. There is no rebound.  Genitourinary:    General: Normal vulva.     Exam position: Lithotomy position.     Pubic Area: No rash or pubic lice.      Labia:        Right: No rash or lesion.        Left: No rash or lesion.      Vagina: Vaginal discharge present. No erythema, bleeding or lesions.     Cervix: Discharge present. No cervical motion tenderness, friability, lesion or erythema.     Uterus: Normal.      Adnexa: Right adnexa normal and left adnexa normal.     Rectum: Normal.     Comments: pH = 4  Redness at vaginal introitus Lymphadenopathy:     Head:     Right side of head: No  preauricular or posterior auricular adenopathy.     Left side of head: No preauricular or posterior auricular adenopathy.     Cervical: No cervical adenopathy.     Upper Body:     Right upper body: No supraclavicular, axillary or epitrochlear adenopathy.     Left upper body: No supraclavicular, axillary or epitrochlear adenopathy.     Lower Body: No right inguinal adenopathy. No left inguinal adenopathy.  Skin:    General: Skin is warm and dry.     Findings: No rash.  Neurological:     Mental Status: She is alert and oriented to person, place, and time.      Assessment and Plan:  Sarah Mcintyre is a 28 y.o. female presenting to the [facility] Department for STI screening  1. Screening for venereal disease (Primary)  - HIV [place] [facility] -  Syphilis Serology, [place] - Chlamydia/Gonorrhea [place] [facility] - WET PREP FOR TRICH, YEAST, CLUE   Patient accepted the following screenings: vaginal CT/GC swab, vaginal wet prep, HIV, and RPR Patient meets criteria for HepB screening? No. Ordered? not applicable Patient meets criteria for HepC screening? No. Ordered? not applicable  Treat wet prep per standing order Discussed time line for State [facility] results and that patient will be called with positive results and encouraged patient to call if she had not heard in 2 weeks.  Counseled to return or seek care for continued or worsening symptoms Recommended repeat testing in 3 months with positive results. Recommended condom use with all sex for STI prevention.   Return if symptoms worsen or fail to improve, for STI screening.  No future appointments.  Verneta Bers, [place]
